# Patient Record
Sex: Male | Born: 1965 | Hispanic: No | Marital: Single | Smoking: Former smoker
Health system: Southern US, Community
[De-identification: ages and names within clinical notes are randomized; demographics above are authoritative.]

## PROBLEM LIST (undated history)

## (undated) DIAGNOSIS — N4889 Other specified disorders of penis: Secondary | ICD-10-CM

## (undated) DIAGNOSIS — H905 Unspecified sensorineural hearing loss: Secondary | ICD-10-CM

## (undated) DIAGNOSIS — E739 Lactose intolerance, unspecified: Secondary | ICD-10-CM

## (undated) DIAGNOSIS — Z974 Presence of external hearing-aid: Secondary | ICD-10-CM

## (undated) HISTORY — PX: INGUINAL HERNIA REPAIR: SHX194

---

## 2013-12-25 HISTORY — PX: WISDOM TOOTH EXTRACTION: SHX21

## 2015-09-29 ENCOUNTER — Other Ambulatory Visit: Payer: Self-pay | Admitting: Urology

## 2015-11-05 ENCOUNTER — Encounter (HOSPITAL_BASED_OUTPATIENT_CLINIC_OR_DEPARTMENT_OTHER): Payer: Self-pay | Admitting: *Deleted

## 2015-11-05 NOTE — Progress Notes (Signed)
NPO AFTER MN.  ARRIVE AT 0600.  NEEDS HG.  

## 2015-11-12 ENCOUNTER — Ambulatory Visit (HOSPITAL_BASED_OUTPATIENT_CLINIC_OR_DEPARTMENT_OTHER): Admission: RE | Admit: 2015-11-12 | Payer: 59 | Source: Ambulatory Visit | Admitting: Urology

## 2015-11-12 HISTORY — DX: Presence of external hearing-aid: Z97.4

## 2015-11-12 HISTORY — DX: Lactose intolerance, unspecified: E73.9

## 2015-11-12 HISTORY — DX: Unspecified sensorineural hearing loss: H90.5

## 2015-11-12 HISTORY — DX: Other specified disorders of penis: N48.89

## 2015-11-12 SURGERY — CYST REMOVAL
Anesthesia: General

## 2017-03-12 DIAGNOSIS — R42 Dizziness and giddiness: Secondary | ICD-10-CM | POA: Diagnosis not present

## 2017-03-12 DIAGNOSIS — R1012 Left upper quadrant pain: Secondary | ICD-10-CM | POA: Diagnosis not present

## 2017-03-16 ENCOUNTER — Other Ambulatory Visit: Payer: Self-pay | Admitting: Family Medicine

## 2017-03-16 DIAGNOSIS — D696 Thrombocytopenia, unspecified: Secondary | ICD-10-CM

## 2017-03-16 DIAGNOSIS — R1012 Left upper quadrant pain: Secondary | ICD-10-CM

## 2017-03-28 ENCOUNTER — Other Ambulatory Visit: Payer: 59

## 2017-04-06 ENCOUNTER — Other Ambulatory Visit: Payer: Self-pay | Admitting: Physician Assistant

## 2017-04-06 DIAGNOSIS — R1012 Left upper quadrant pain: Secondary | ICD-10-CM

## 2017-04-06 DIAGNOSIS — D696 Thrombocytopenia, unspecified: Secondary | ICD-10-CM

## 2017-04-06 DIAGNOSIS — K625 Hemorrhage of anus and rectum: Secondary | ICD-10-CM | POA: Diagnosis not present

## 2017-04-13 ENCOUNTER — Ambulatory Visit
Admission: RE | Admit: 2017-04-13 | Discharge: 2017-04-13 | Disposition: A | Discharge status: Home / Self Care | Payer: 59 | Source: Ambulatory Visit | Attending: Physician Assistant | Admitting: Physician Assistant

## 2017-04-13 ENCOUNTER — Other Ambulatory Visit: Payer: 59

## 2017-04-13 DIAGNOSIS — R1032 Left lower quadrant pain: Secondary | ICD-10-CM | POA: Diagnosis not present

## 2017-04-13 DIAGNOSIS — K8020 Calculus of gallbladder without cholecystitis without obstruction: Secondary | ICD-10-CM | POA: Diagnosis not present

## 2017-04-13 DIAGNOSIS — D696 Thrombocytopenia, unspecified: Secondary | ICD-10-CM

## 2017-04-13 DIAGNOSIS — R1012 Left upper quadrant pain: Secondary | ICD-10-CM

## 2017-04-13 DIAGNOSIS — K625 Hemorrhage of anus and rectum: Secondary | ICD-10-CM

## 2017-04-13 IMAGING — CT CT ABD-PELV W/ CM
3 of 5 series · 13 of 36 positions shown, 19 images · IV contrast (READICAT/WATER & [ID] ISOVUE 300)
Comparison: None.

CLINICAL DATA: Left lower quadrant pain for 6 months.
Thrombocytopenia. Rectal bleeding.

EXAM:
CT ABDOMEN AND PELVIS WITH CONTRAST
TECHNIQUE: Multidetector CT imaging of the abdomen and pelvis was performed
using the standard protocol following bolus administration of
intravenous contrast.
CONTRAST:  100mL [VE] IOPAMIDOL ([VE]) INJECTION 61%

[Series 3: abd/pelvis with · axial · 0.79mm/px · z∈[-351,-21]mm · 7 of 90 slices shown, 12 images]
[im 12/90  soft-tissue]
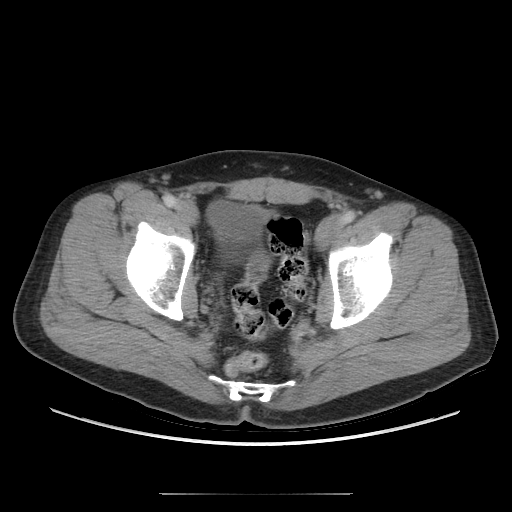
[im 12/90  bone]
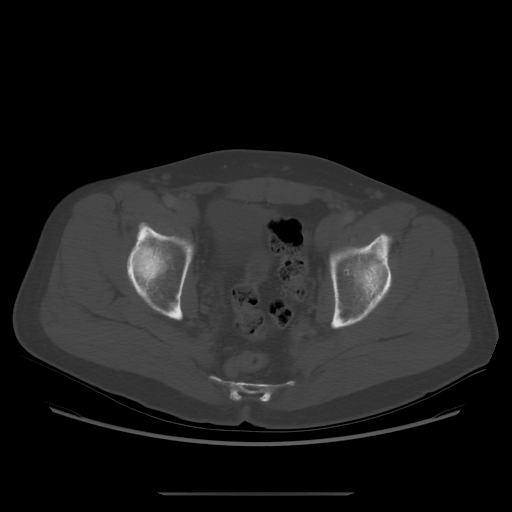
[im 23/90  soft-tissue]
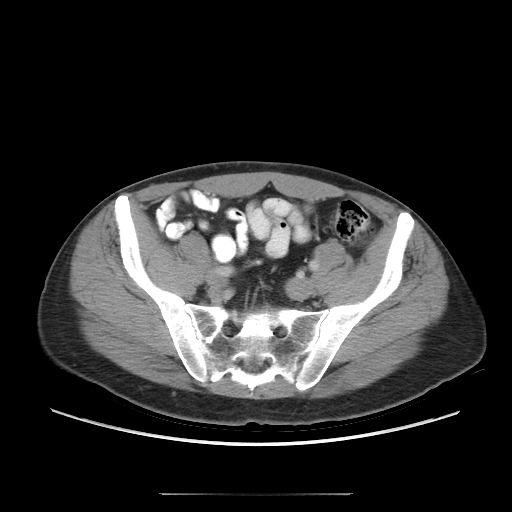
[im 34/90  soft-tissue]
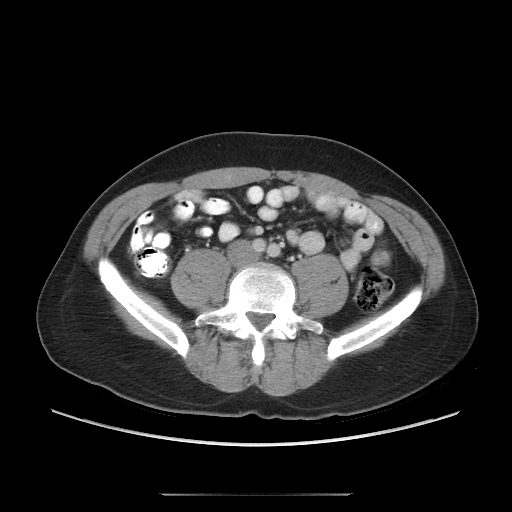
[im 45/90  soft-tissue]
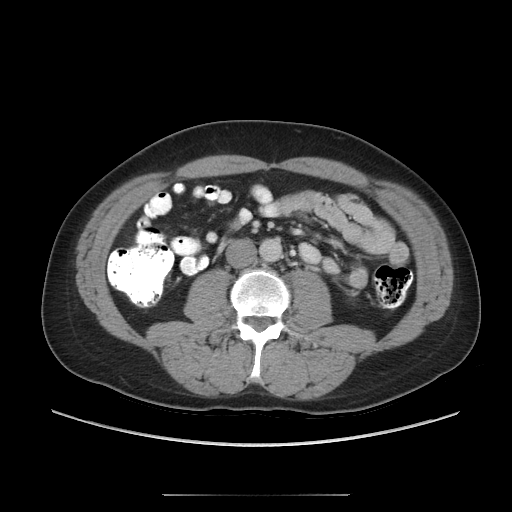
[im 45/90  lung]
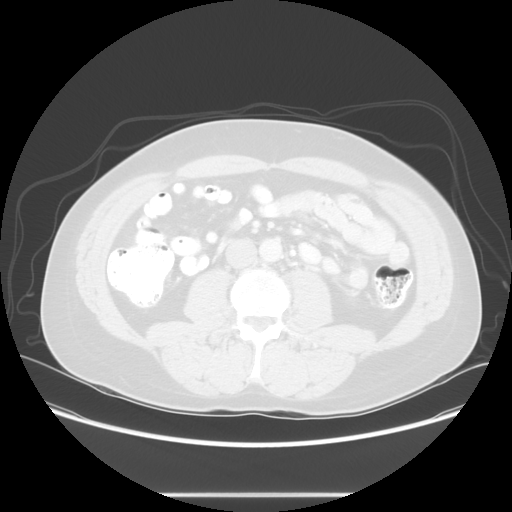
[im 56/90  soft-tissue]
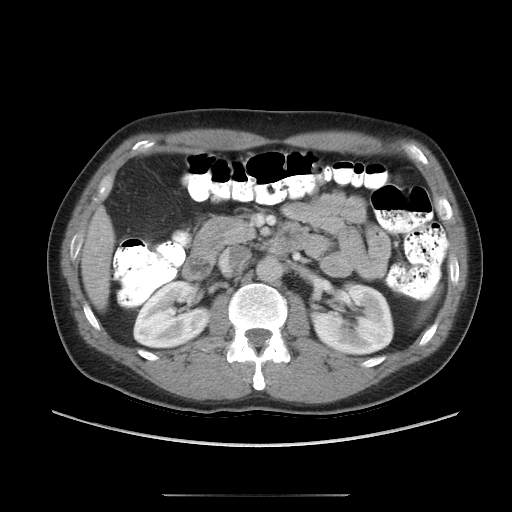
[im 56/90  lung]
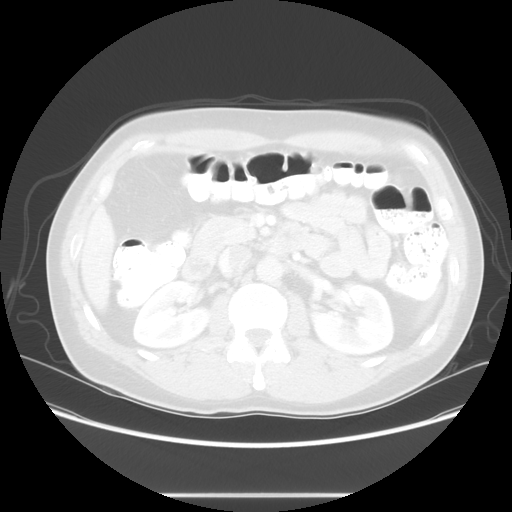
[im 67/90  soft-tissue]
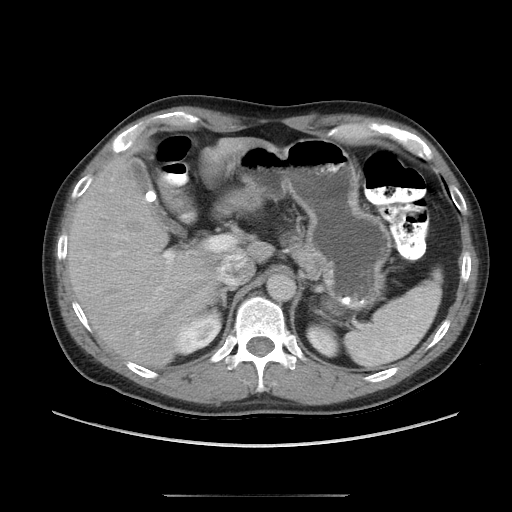
[im 67/90  lung]
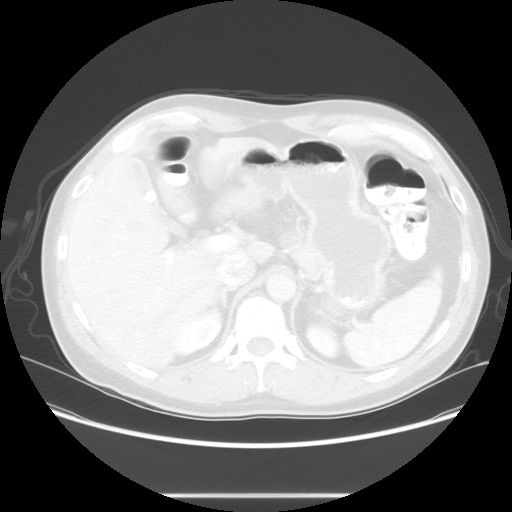
[im 78/90  soft-tissue]
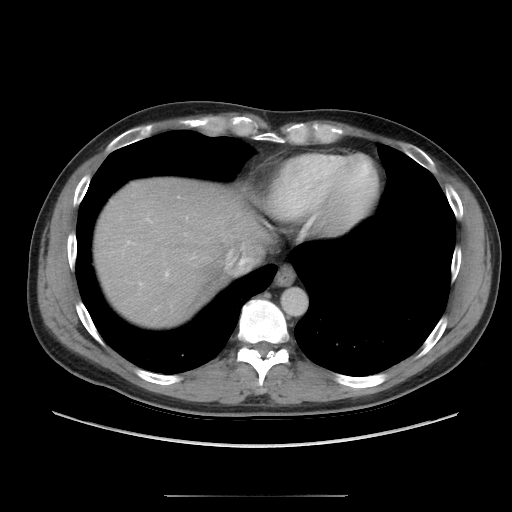
[im 78/90  lung]
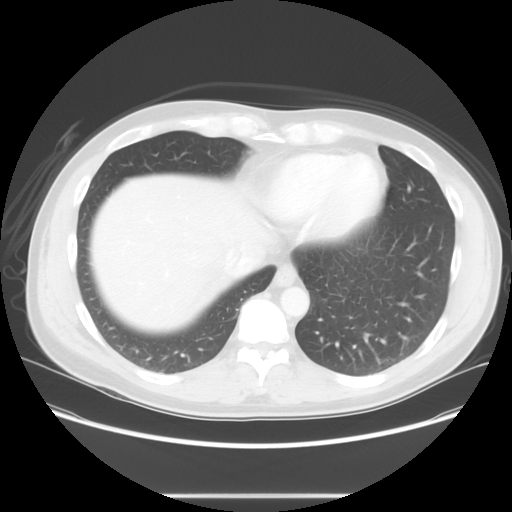

[Series 601: coronal body · coronal · 1.07mm/px · 1 of 114 slices shown, 2 images]
[im 38/114  soft-tissue]
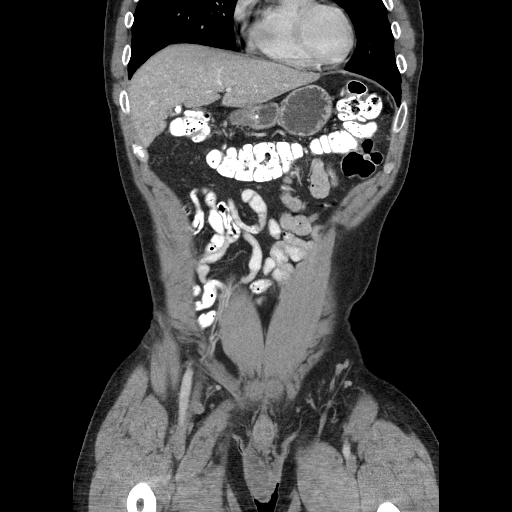
[im 38/114  bone]
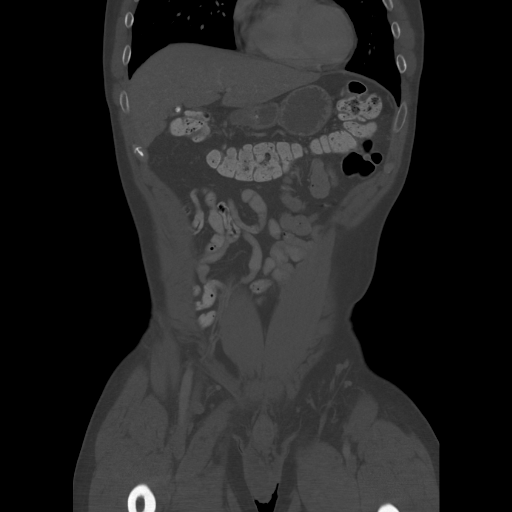

[Series 602: sagittal body · sagittal · 1.07mm/px · 5 of 164 slices shown]
[im 11/164  soft-tissue]
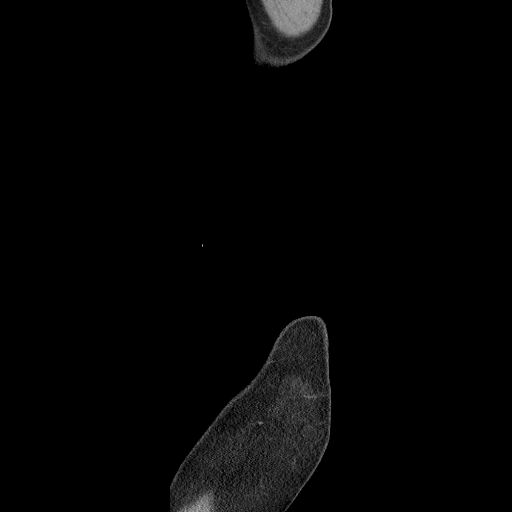
[im 31/164  soft-tissue]
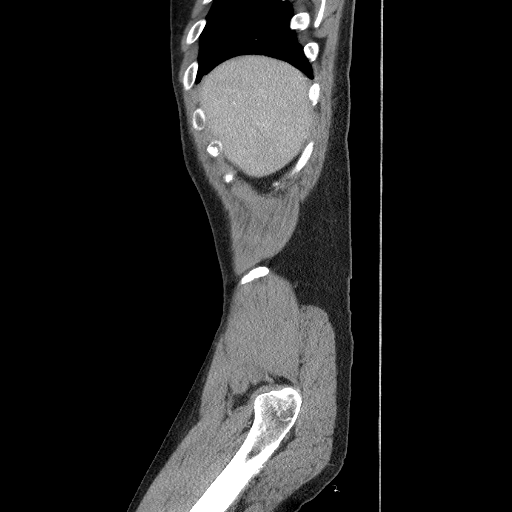
[im 51/164  soft-tissue]
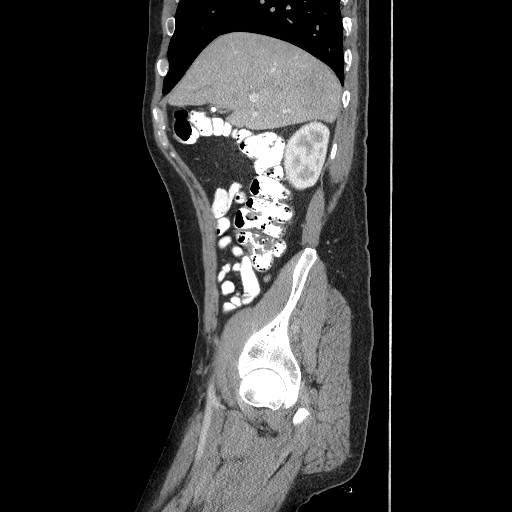
[im 72/164  soft-tissue]
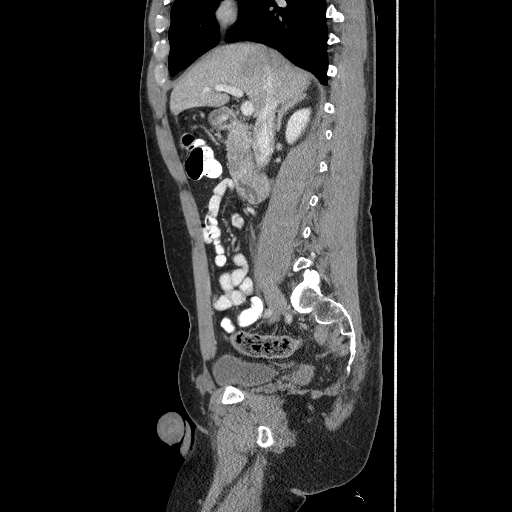
[im 92/164  soft-tissue]
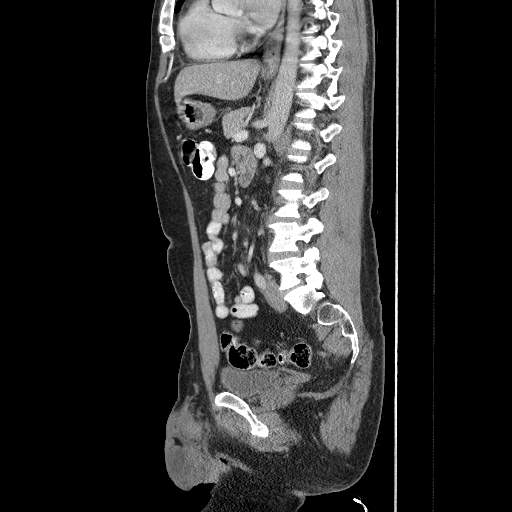

[13 of 36 positions shown; findings below may reference images not displayed]

FINDINGS: Lower Chest: No acute findings.

Hepatobiliary: No masses identified. Tiny sub-cm cyst seen in dome
of right hepatic lobe. A calcified gallstone is seen measuring
approximately 1 cm. No evidence of cholecystitis or biliary ductal
dilatation.

Pancreas:  No mass or inflammatory changes.

Spleen: Within normal limits in size and appearance.

Adrenals/Urinary Tract: No masses identified. No evidence of
hydronephrosis.

Stomach/Bowel: No evidence of obstruction, inflammatory process or
abnormal fluid collections. Normal appendix visualized.

Vascular/Lymphatic: No pathologically enlarged lymph nodes. No
abdominal aortic aneurysm.

Reproductive:  No mass or other significant abnormality.

Other:  None.

Musculoskeletal:  No suspicious bone lesions identified.
IMPRESSION: Cholelithiasis. No radiographic evidence of cholecystitis or other
significant abnormality.

## 2017-04-13 MED ORDER — IOPAMIDOL (ISOVUE-300) INJECTION 61%
100.0000 mL | Freq: Once | INTRAVENOUS | Status: AC | PRN
  Administered 2017-04-13: 100 mL via INTRAVENOUS

## 2018-05-02 DIAGNOSIS — Z13220 Encounter for screening for lipoid disorders: Secondary | ICD-10-CM | POA: Diagnosis not present

## 2018-05-02 DIAGNOSIS — Z0000 Encounter for general adult medical examination without abnormal findings: Secondary | ICD-10-CM | POA: Diagnosis not present
